# Patient Record
Sex: Female | Born: 1960 | Race: Black or African American | Hispanic: Yes | Marital: Single | Smoking: Never smoker
Health system: Southern US, Community
[De-identification: ages and names within clinical notes are randomized; demographics above are authoritative.]

## PROBLEM LIST (undated history)

## (undated) DIAGNOSIS — F329 Major depressive disorder, single episode, unspecified: Secondary | ICD-10-CM

## (undated) DIAGNOSIS — F32A Depression, unspecified: Secondary | ICD-10-CM

## (undated) HISTORY — PX: ABDOMINAL HYSTERECTOMY: SHX81

---

## 2012-11-24 ENCOUNTER — Encounter (HOSPITAL_BASED_OUTPATIENT_CLINIC_OR_DEPARTMENT_OTHER): Payer: Self-pay | Admitting: Family Medicine

## 2012-11-24 ENCOUNTER — Emergency Department (HOSPITAL_BASED_OUTPATIENT_CLINIC_OR_DEPARTMENT_OTHER)
Admission: EM | Admit: 2012-11-24 | Discharge: 2012-11-24 | Disposition: A | Discharge status: Home / Self Care | Payer: Self-pay | Attending: Emergency Medicine | Admitting: Emergency Medicine

## 2012-11-24 DIAGNOSIS — R52 Pain, unspecified: Secondary | ICD-10-CM | POA: Insufficient documentation

## 2012-11-24 DIAGNOSIS — F329 Major depressive disorder, single episode, unspecified: Secondary | ICD-10-CM | POA: Insufficient documentation

## 2012-11-24 DIAGNOSIS — K089 Disorder of teeth and supporting structures, unspecified: Secondary | ICD-10-CM | POA: Insufficient documentation

## 2012-11-24 DIAGNOSIS — G8929 Other chronic pain: Secondary | ICD-10-CM | POA: Insufficient documentation

## 2012-11-24 DIAGNOSIS — K0889 Other specified disorders of teeth and supporting structures: Secondary | ICD-10-CM

## 2012-11-24 DIAGNOSIS — F3289 Other specified depressive episodes: Secondary | ICD-10-CM | POA: Insufficient documentation

## 2012-11-24 DIAGNOSIS — Z79899 Other long term (current) drug therapy: Secondary | ICD-10-CM | POA: Insufficient documentation

## 2012-11-24 DIAGNOSIS — K029 Dental caries, unspecified: Secondary | ICD-10-CM | POA: Insufficient documentation

## 2012-11-24 HISTORY — DX: Major depressive disorder, single episode, unspecified: F32.9

## 2012-11-24 HISTORY — DX: Depression, unspecified: F32.A

## 2012-11-24 MED ORDER — BUPIVACAINE-EPINEPHRINE (PF) 0.5% -1:200000 IJ SOLN
INTRAMUSCULAR | Status: AC
  Filled 2012-11-24: qty 7.2

## 2012-11-24 MED ORDER — BUPIVACAINE-EPINEPHRINE PF 0.5-1:200000 % IJ SOLN
1.8000 mL | Freq: Once | INTRAMUSCULAR | Status: DC
  Filled 2012-11-24: qty 1.8

## 2012-11-24 MED ORDER — PENICILLIN V POTASSIUM 500 MG PO TABS: 500.0000 mg | ORAL_TABLET | Freq: Four times a day (QID) | ORAL | Status: AC

## 2012-11-24 NOTE — ED Provider Notes (Signed)
CSN: [id number]     Arrival date & time 11/24/12  4098 History     First MD Initiated Contact with Patient 11/24/12 1001     Chief Complaint  Patient presents with  . Dental Problem   (Consider location/radiation/quality/duration/timing/severity/associated sxs/prior Treatment) Patient is a 52 y.o. female presenting with tooth pain. The history is provided by the patient.  Dental Pain Location:  Lower Lower teeth location:  30/RL 1st molar Quality:  Aching Severity:  Severe Onset quality:  Gradual Duration: over 3 weeks. Timing:  Constant Progression:  Worsening Chronicity:  Chronic Context: poor dentition   Relieved by:  NSAIDs Exacerbated by: eating. Associated symptoms: no congestion, no difficulty swallowing, no drooling, no facial swelling, no fever, no gum swelling, no neck pain, no neck swelling and no trismus   Risk factors: no diabetes     Past Medical History  Diagnosis Date  . Depression    Past Surgical History  Procedure Laterality Date  . Abdominal hysterectomy     No family history on file. History  Substance Use Topics  . Smoking status: Never Smoker   . Smokeless tobacco: Not on file  . Alcohol Use: No   OB History   Grav Para Term Preterm Abortions TAB SAB Ect Mult Living                 Review of Systems  Constitutional: Negative for fever and chills.  HENT: Positive for dental problem. Negative for congestion, facial swelling, rhinorrhea, drooling, trouble swallowing, neck pain and voice change.   All other systems reviewed and are negative.    Allergies  Review of patient's allergies indicates no known allergies.  Home Medications   Current Outpatient Rx  Name  Route  Sig  Dispense  Refill  . FLUoxetine HCl (PROZAC PO)   Oral   Take by mouth.          BP 162/89  Pulse 89  Temp(Src) 97.8 F (36.6 C) (Oral)  Resp 16  Ht 5\' 4"  (1.626 m)  Wt 185 lb (83.915 kg)  BMI 31.74 kg/m2  SpO2 100% Physical Exam  Nursing note and  vitals reviewed. Constitutional: She is oriented to person, place, and time. She appears well-developed and well-nourished. No distress.  HENT:  Head: Normocephalic and atraumatic. No trismus in the jaw.  Right Ear: External ear normal.  Left Ear: External ear normal.  Mouth/Throat: Oropharynx is clear and moist and mucous membranes are normal. Abnormal dentition. Dental caries present. No dental abscesses or edematous.    Diffusely poor dentition. Multiple teeth missing, other teeth with cavities. No surrounding gingival erythema or dental abscess  Neck: Neck supple.  Pulmonary/Chest: Effort normal.  Abdominal: She exhibits no distension.  Lymphadenopathy:    She has no cervical adenopathy.  Neurological: She is alert and oriented to person, place, and time.    ED Course   NERVE BLOCK Date/Time: 11/24/2012 10:35 AM Performed by: Pricilla Loveless T Authorized by: Pricilla Loveless T Consent: Verbal consent obtained. Risks and benefits: risks, benefits and alternatives were discussed Indications: pain relief Body area: face/mouth Nerve: inferior alveolar Laterality: right Patient sedated: no Needle gauge: 27 G Location technique: anatomical landmarks Local anesthetic: bupivacaine 0.5% with epinephrine Anesthetic total: 1.8 ml Outcome: pain improved Patient tolerance: Patient tolerated the procedure well with no immediate complications.   (including critical care time)  Labs Reviewed - No data to display No results found. 1. Pain, dental     MDM  52 year old female with  acute on chronic right lower molar pain. No systemic symptoms facial swelling or signs of dental abscess. Pain relieved with a inferior alveolar block. Has overall poor dentition with likely underlying tooth infection. Will prescribe antibiotics and give outpatient dental referral.  Audree Camel, MD 11/24/12 1139

## 2012-11-24 NOTE — ED Notes (Signed)
Pt c/o "hole" to right lower tooth "for a while" but hurts worse recently. Pt sts she does not have a dentist.

## 2017-03-01 ENCOUNTER — Other Ambulatory Visit: Payer: Self-pay

## 2017-03-01 ENCOUNTER — Encounter (HOSPITAL_BASED_OUTPATIENT_CLINIC_OR_DEPARTMENT_OTHER): Payer: Self-pay | Admitting: Emergency Medicine

## 2017-03-01 ENCOUNTER — Emergency Department (HOSPITAL_BASED_OUTPATIENT_CLINIC_OR_DEPARTMENT_OTHER): Payer: Self-pay

## 2017-03-01 ENCOUNTER — Emergency Department (HOSPITAL_BASED_OUTPATIENT_CLINIC_OR_DEPARTMENT_OTHER)
Admission: EM | Admit: 2017-03-01 | Discharge: 2017-03-01 | Disposition: A | Discharge status: Home / Self Care | Payer: Self-pay | Attending: Emergency Medicine | Admitting: Emergency Medicine

## 2017-03-01 DIAGNOSIS — Z79899 Other long term (current) drug therapy: Secondary | ICD-10-CM | POA: Insufficient documentation

## 2017-03-01 DIAGNOSIS — M25561 Pain in right knee: Secondary | ICD-10-CM | POA: Insufficient documentation

## 2017-03-01 DIAGNOSIS — G8929 Other chronic pain: Secondary | ICD-10-CM | POA: Insufficient documentation

## 2017-03-01 DIAGNOSIS — F329 Major depressive disorder, single episode, unspecified: Secondary | ICD-10-CM | POA: Insufficient documentation

## 2017-03-01 NOTE — ED Provider Notes (Signed)
[facility] EMERGENCY DEPARTMENT Provider Note   CSN: [id number] Arrival date & time: 03/01/17  1313     History   Chief Complaint Chief Complaint  Patient presents with  . Knee Pain    HPI Kari DialsBetty Kerth is a 56 y.o. female.  The history is provided by the patient. No language interpreter was used.  Knee Pain      Kari Moyer is a 56 y.o. female who presents to the Emergency Department complaining of right knee pain.  She reports one year of right knee pain.  Pain is constant but worse at night and with standing.  Pain is located throughout the knee.  No swelling to the knee.  No fevers, chest pain, shortness of breath.  She has tried Biofreeze cream as well as Aleve at home with no significant change in her symptoms.  Symptoms are mild to moderate and constant in nature.  Past Medical History:  Diagnosis Date  . Depression     There are no active problems to display for this patient.   Past Surgical History:  Procedure Laterality Date  . ABDOMINAL HYSTERECTOMY      OB History    No data available       Home Medications    Prior to Admission medications   Medication Sig Start Date End Date Taking? Authorizing Provider  FLUoxetine HCl (PROZAC PO) Take by mouth.    [provider]    Family History History reviewed. No pertinent family history.  Social History Social History   Tobacco Use  . Smoking status: Never Smoker  . Smokeless tobacco: Never Used  Substance Use Topics  . Alcohol use: No  . Drug use: No     Allergies   Patient has no known allergies.   Review of Systems Review of Systems  All other systems reviewed and are negative.    Physical Exam Updated Vital Signs BP (!) 179/84 (BP Location: Left Arm)   Pulse 89   Temp 98.1 F (36.7 C) (Oral)   Resp 16   Ht 5\' 4"  (1.626 m)   Wt 86.2 kg (190 lb)   SpO2 100%   BMI 32.61 kg/m   Physical Exam  Constitutional: She is oriented to person, place, and time.  She appears well-developed and well-nourished.  HENT:  Head: Normocephalic and atraumatic.  Cardiovascular: Normal rate and regular rhythm.  No murmur heard. Pulmonary/Chest: Effort normal and breath sounds normal. No respiratory distress.  Abdominal: There is no guarding.  Musculoskeletal: She exhibits no edema or tenderness.  2+ DP pulses bilaterally.  There is no significant tenderness to palpation throughout the right knee.  No edema to the knee.  Flexion extension is intact in the knee.  No ligamentous laxity to the knee.  Neurological: She is alert and oriented to person, place, and time.  Skin: Skin is warm and dry.  Psychiatric: She has a normal mood and affect. Her behavior is normal.  Nursing note and vitals reviewed.    ED Treatments / Results  Labs (all labs ordered are listed, but only abnormal results are displayed) Labs Reviewed - No data to display  EKG  EKG Interpretation None       Radiology Dg Knee Complete 4 Views Right  Result Date: 03/01/2017 CLINICAL DATA:  Chronic worsening right knee pain since a fall approximately 1 year ago. EXAM: RIGHT KNEE - COMPLETE 4+ VIEW COMPARISON:  Report of her right knee series dated June 12, 2001 FINDINGS: The bones  are subjectively adequately mineralized. The joint spaces are well maintained. There is mild beaking of the tibial spines. There is no acute or healing fracture. There is no joint effusion or chondrocalcinosis. IMPRESSION: There is no acute bony abnormality of the right knee. Mild degenerative beaking of the tibial spines is observed. Electronically Signed   By: David  [place] M.D.   On: 03/01/2017 14:02    Procedures Procedures (including critical care time)  Medications Ordered in ED Medications - No data to display   Initial Impression / Assessment and Plan / ED Course  I have reviewed the triage vital signs and the nursing notes.  Pertinent labs & imaging results that were available during my care of  the patient were reviewed by me and considered in my medical decision making (see chart for details).     Patient here for evaluation of 1 year of right knee pain infectious process.  No evidence of fracture or dislocation on plain films.  Discussed with patient the unclear etiology of her chronic pain, question arthritis.  Discussed with patient home care with Tylenol and Aleve per label instructions.  Will provide knee sleeve for comfort.  Discussed using over-the-counter topical creams such as Lidoderm cream.  Discussed outpatient orthopedics follow-up and return precautions.  Final Clinical Impressions(s) / ED Diagnoses   Final diagnoses:  Chronic pain of right knee    ED Discharge Orders    None       Tilden Fossa, MD 03/01/17 1428

## 2017-03-01 NOTE — ED Triage Notes (Signed)
Reports right knee pain x 3-4 years.  States this is getting worse than in the past.  Reports injury 3-4 years ago without xrays.

## 2020-03-31 ENCOUNTER — Emergency Department (HOSPITAL_BASED_OUTPATIENT_CLINIC_OR_DEPARTMENT_OTHER): Payer: BLUE CROSS/BLUE SHIELD

## 2020-03-31 ENCOUNTER — Other Ambulatory Visit: Payer: Self-pay

## 2020-03-31 ENCOUNTER — Encounter (HOSPITAL_BASED_OUTPATIENT_CLINIC_OR_DEPARTMENT_OTHER): Payer: Self-pay | Admitting: Emergency Medicine

## 2020-03-31 ENCOUNTER — Emergency Department (HOSPITAL_BASED_OUTPATIENT_CLINIC_OR_DEPARTMENT_OTHER)
Admission: EM | Admit: 2020-03-31 | Discharge: 2020-03-31 | Disposition: A | Discharge status: Home / Self Care | Payer: BLUE CROSS/BLUE SHIELD | Attending: Emergency Medicine | Admitting: Emergency Medicine

## 2020-03-31 DIAGNOSIS — M25562 Pain in left knee: Secondary | ICD-10-CM | POA: Insufficient documentation

## 2020-03-31 DIAGNOSIS — M25462 Effusion, left knee: Secondary | ICD-10-CM | POA: Insufficient documentation

## 2020-03-31 MED ORDER — DICLOFENAC SODIUM 1 % EX GEL: 2.0000 g | Freq: Four times a day (QID) | CUTANEOUS | 0 refills | Status: AC

## 2020-03-31 NOTE — ED Provider Notes (Signed)
[facility] EMERGENCY DEPARTMENT Provider Note   CSN: [id number] Arrival date & time: 03/31/20  1326     History Chief Complaint  Patient presents with  . Knee Pain    Kari Moyer is a 59 y.o. female.  59 year old female with report of pain in her anterior left knee for the past few days.  Patient states she has a history of arthritis in the right knee, is concerned she has arthritis in the left knee now.  Denies any specific injury.  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  Pain is worse with flexion of the knee and bearing weight.  Patient has been wearing a knee sleeve and taking Tylenol without improvement in her pain.  No other injuries, complaints, concerns.        Past Medical History:  Diagnosis Date  . Depression     There are no problems to display for this patient.   Past Surgical History:  Procedure Laterality Date  . ABDOMINAL HYSTERECTOMY       OB History   No obstetric history on file.     No family history on file.  Social History   Tobacco Use  . Smoking status: Never Smoker  . Smokeless tobacco: Never Used  Substance Use Topics  . Alcohol use: No  . Drug use: No    Home Medications Prior to Admission medications   Medication Sig Start Date End Date Taking? Authorizing Provider  diclofenac Sodium (VOLTAREN) 1 % GEL Apply 2 g topically 4 (four) times daily. 03/31/20   Jeannie Fend, PA-C  FLUoxetine HCl (PROZAC PO) Take by mouth.    [provider]    Allergies    Patient has no known allergies.  Review of Systems   Review of Systems  Constitutional: Negative for fever.  Musculoskeletal: Positive for arthralgias and joint swelling.  Skin: Negative for color change, rash and wound.  Neurological: Negative for weakness and numbness.    Physical Exam Updated Vital Signs BP (!) 164/83 (BP Location: Right Arm)   Pulse 62   Temp 98.4  F (36.9 C) (Oral)   Resp 17   Ht 5\' 4"  (1.626 m)   Wt 83.9 kg   SpO2 100%   BMI 31.76 kg/m   Physical Exam Vitals and nursing note reviewed.  Constitutional:      General: She is not in acute distress.    Appearance: She is well-developed. She is not diaphoretic.  HENT:     Head: Normocephalic and atraumatic.  Cardiovascular:     Pulses: Normal pulses.  Pulmonary:     Effort: Pulmonary effort is normal.  Musculoskeletal:        General: Tenderness present. No swelling or deformity.     Left knee: No swelling, effusion or erythema. Normal range of motion. No LCL laxity, MCL laxity, ACL laxity or PCL laxity.      Legs:  Skin:    General: Skin is warm and dry.     Findings: No erythema.  Neurological:     Mental Status: She is alert and oriented to person, place, and time.     Sensory: No sensory deficit.  Psychiatric:        Behavior: Behavior normal.     ED Results / Procedures / Treatments   Labs (all labs ordered are listed, but only abnormal results are displayed) Labs Reviewed - No  data to display  EKG None  Radiology DG Knee Complete 4 Views Left  Result Date: 03/31/2020 CLINICAL DATA:  Left knee pain for 1 month, no known injury, initial encounter EXAM: LEFT KNEE - COMPLETE 4+ VIEW COMPARISON:  None. FINDINGS: Small joint effusion is noted. No acute fracture or dislocation is noted. Mild patellofemoral degenerative changes are seen. IMPRESSION: Mild degenerative change with small joint effusion. No acute abnormality noted. Electronically Signed   By: Alcide Clever M.D.   On: 03/31/2020 14:14    Procedures Procedures (including critical care time)  Medications Ordered in ED Medications - No data to display  ED Course  I have reviewed the triage vital signs and the nursing notes.  Pertinent labs & imaging results that were available during my care of the patient were reviewed by me and considered in my medical decision making (see chart for  details).  Clinical Course as of Apr 01 1635  Wynelle Link Mar 31, 2020  7429 59 year old female with left knee pain.  On exam found to have medial joint line tenderness.  No overlying erythema, normal range of motion, no ligamentous laxity.  X-ray shows mild degenerative changes with small joint effusion.  Discussed results with patient.  Plan is to continue wearing the sleeve, may apply Voltaren gel.  Referred to sports medicine for follow-up.   [LM]    Clinical Course User Index [LM] Alden Hipp   MDM Rules/Calculators/A&P                          Final Clinical Impression(s) / ED Diagnoses Final diagnoses:  Acute pain of left knee  Effusion of left knee    Rx / DC Orders ED Discharge Orders         Ordered    diclofenac Sodium (VOLTAREN) 1 % GEL  4 times daily        03/31/20 1631           Jeannie Fend, PA-C 03/31/20 1636    Derwood Kaplan, MD 04/01/20 [phone]

## 2020-03-31 NOTE — ED Triage Notes (Signed)
L knee pain x 1 month. No known injury.

## 2020-03-31 NOTE — Discharge Instructions (Addendum)
Apply Voltaren gel as prescribed. Follow up with sports medicine.

## 2020-04-08 ENCOUNTER — Ambulatory Visit (INDEPENDENT_AMBULATORY_CARE_PROVIDER_SITE_OTHER): Payer: BLUE CROSS/BLUE SHIELD | Admitting: Family Medicine

## 2020-04-08 ENCOUNTER — Other Ambulatory Visit: Payer: Self-pay

## 2020-04-08 ENCOUNTER — Encounter: Payer: Self-pay | Admitting: Family Medicine

## 2020-04-08 ENCOUNTER — Ambulatory Visit: Payer: Self-pay

## 2020-04-08 VITALS — BP 169/81 | HR 77 | Ht 64.0 in | Wt 187.0 lb

## 2020-04-08 DIAGNOSIS — M25562 Pain in left knee: Secondary | ICD-10-CM

## 2020-04-08 DIAGNOSIS — M84453A Pathological fracture, unspecified femur, initial encounter for fracture: Secondary | ICD-10-CM | POA: Diagnosis not present

## 2020-04-08 MED ORDER — HYDROCODONE-ACETAMINOPHEN 5-325 MG PO TABS: 1.0000 | ORAL_TABLET | Freq: Three times a day (TID) | ORAL | 0 refills | Status: AC | PRN

## 2020-04-08 NOTE — Assessment & Plan Note (Signed)
Pain has been ongoing since August or September.  Has tried steroid injections and medications with no improvement of her symptoms.  Findings on ultrasound revealing for degenerative changes but also suspicious for insufficiency fracture. -Counseled on home exercise therapy and supportive care. -Norco. -Duexis samples. -Hinged knee brace. -Rollator. -MRI to evaluate for insufficiency fracture.

## 2020-04-08 NOTE — Progress Notes (Signed)
Medication Samples have been provided to the patient.  Drug name: Duexis       Strength: 800mg /26.6mg         Qty: 2 boxes  LOT:  Exp.Date: 08/2020  Dosing instructions: Take 1 tablet by mouth three (3) times a day.  The patient has been instructed regarding the correct time, dose, and frequency of taking this medication, including desired effects and most common side effects.   09/2020, Kathi Simpers 11:49 AM 04/08/2020

## 2020-04-08 NOTE — Progress Notes (Signed)
  Kari Moyer - 59 y.o. female MRN [id number]  Date of birth: 15-Dec-1960  SUBJECTIVE:  Including CC & ROS.  Chief Complaint  Patient presents with  . Knee Pain    Left x 2 months    Kari Moyer is a 59 y.o. female that is presenting with acute on chronic left knee pain.  The pain has been ongoing since before September.  She has received a steroid injection.  She had little improvement with this injection.  She is also tried diclofenac topical cream as well as meloxicam with limited improvement.  She is having severe pain on the medial aspect of the knee.  Has mechanical symptoms intermittently.  Denies any injury or inciting event.  No prior history of pain or surgery.  Independent review of the left knee x-ray from 12/5 shows mild degenerative changes of the medial compartment with a loose body posteriorly.  Review of Systems See HPI   HISTORY: Past Medical, Surgical, Social, and Family History Reviewed & Updated per EMR.   Pertinent Historical Findings include:  Past Medical History:  Diagnosis Date  . Depression     Past Surgical History:  Procedure Laterality Date  . ABDOMINAL HYSTERECTOMY      No family history on file.  Social History   Socioeconomic History  . Marital status: Single    Spouse name: Not on file  . Number of children: Not on file  . Years of education: Not on file  . Highest education level: Not on file  Occupational History  . Not on file  Tobacco Use  . Smoking status: Never Smoker  . Smokeless tobacco: Never Used  Substance and Sexual Activity  . Alcohol use: No  . Drug use: No  . Sexual activity: Not on file  Other Topics Concern  . Not on file  Social History Narrative  . Not on file   Social Determinants of Health   Financial Resource Strain: Not on file  Food Insecurity: Not on file  Transportation Needs: Not on file  Physical Activity: Not on file  Stress: Not on file  Social Connections: Not on file  Intimate Partner  Violence: Not on file     PHYSICAL EXAM:  VS: BP (!) 169/81   Pulse 77   Ht 5\' 4"  (1.626 m)   Wt 187 lb (84.8 kg)   BMI 32.10 kg/m  Physical Exam Gen: NAD, alert, cooperative with exam, well-appearing MSK:  Left knee: No obvious effusion. Limited extension and flexion. Tenderness palpation of the medial compartment. Neurovascularly intact  Limited ultrasound: Left knee:  Moderate effusion. Normal-appearing quadricep patellar tendon. Moderate medial joint space narrowing with outpouching of the meniscus. Hyperemia over the medial meniscus as well as the medial femoral condyle.  This would suggest that an insufficiency fracture of the medial femoral condyle.  Summary: Findings suggest insufficiency fracture of the medial femoral condyle.  Ultrasound and interpretation by , MD    ASSESSMENT & PLAN:   Insufficiency fracture of medial femoral condyle (HCC) Pain has been ongoing since August or September.  Has tried steroid injections and medications with no improvement of her symptoms.  Findings on ultrasound revealing for degenerative changes but also suspicious for insufficiency fracture. -Counseled on home exercise therapy and supportive care. -Norco. -Duexis samples. -Hinged knee brace. -Rollator. -MRI to evaluate for insufficiency fracture.

## 2020-04-08 NOTE — Patient Instructions (Signed)
Nice to meet you  Please try ice  Please limit the weight bearing.  Please use the duexis  Please use the norco for severe pain  Please send me a message in MyChart with any questions or updates.  We will schedule a virtual visit once the MRI is resulted.   --Dr. Jordan Likes

## 2020-04-13 ENCOUNTER — Other Ambulatory Visit: Payer: Self-pay

## 2020-04-13 ENCOUNTER — Ambulatory Visit (INDEPENDENT_AMBULATORY_CARE_PROVIDER_SITE_OTHER): Payer: BLUE CROSS/BLUE SHIELD

## 2020-04-13 DIAGNOSIS — M84453A Pathological fracture, unspecified femur, initial encounter for fracture: Secondary | ICD-10-CM

## 2020-04-13 DIAGNOSIS — S83242A Other tear of medial meniscus, current injury, left knee, initial encounter: Secondary | ICD-10-CM | POA: Diagnosis not present

## 2020-04-13 DIAGNOSIS — S72435A Nondisplaced fracture of medial condyle of left femur, initial encounter for closed fracture: Secondary | ICD-10-CM

## 2020-04-17 ENCOUNTER — Telehealth (INDEPENDENT_AMBULATORY_CARE_PROVIDER_SITE_OTHER): Payer: BLUE CROSS/BLUE SHIELD | Admitting: Family Medicine

## 2020-04-17 DIAGNOSIS — M1712 Unilateral primary osteoarthritis, left knee: Secondary | ICD-10-CM | POA: Diagnosis not present

## 2020-04-17 DIAGNOSIS — M84453A Pathological fracture, unspecified femur, initial encounter for fracture: Secondary | ICD-10-CM | POA: Diagnosis not present

## 2020-04-17 NOTE — Assessment & Plan Note (Signed)
MRI was revealing for subchondral fracture of the central medial femoral condyle with meniscal tearing in the medial meniscus and grade 1-2 MCL sprain. She continues to have pain. Has not been able to get the rollator.  - counseled on home exercise therapy and supportive care - try gel injections  -Could consider physical therapy.

## 2020-04-17 NOTE — Progress Notes (Addendum)
Virtual Visit via Telephone Note  I connected with Kari Moyer on 04/22/20 at  8:00 AM EST by telephone and verified that I am speaking with the correct person using two identifiers.  Location: Patient: home Provider: work    I discussed the limitations, risks, security and privacy concerns of performing an evaluation and management service by telephone and the availability of in person appointments. I also discussed with the patient that there may be a patient responsible charge related to this service. The patient expressed understanding and agreed to proceed.   History of Present Illness:  Kari Moyer is a 59 year old female that is following up after the MRI of her left knee.  This was revealing for the subchondral fracture as well as meniscal tearing and MCL sprain.  She also had a moderate effusion.  She continues to have pain and has not been able to get the Rollator yet.   Observations/Objective:   Assessment and Plan:  Insufficiency fracture of the medial femoral condyle: MRI was revealing for subchondral fracture of the central medial femoral condyle with meniscal tearing in the medial meniscus and grade 1-2 MCL sprain. She continues to have pain. Has not been able to get the rollator.  - counseled on home exercise therapy and supportive care - try gel injections  -Could consider physical therapy.  OA knee:  - gel injections.   Follow Up Instructions:    I discussed the assessment and treatment plan with the patient. The patient was provided an opportunity to ask questions and all were answered. The patient agreed with the plan and demonstrated an understanding of the instructions.   The patient was advised to call back or seek an in-person evaluation if the symptoms worsen or if the condition fails to improve as anticipated.  I provided 5 minutes of non-face-to-face time during this encounter.   Clare Gandy, MD

## 2020-04-18 ENCOUNTER — Encounter: Payer: Self-pay | Admitting: Family Medicine

## 2020-04-18 DIAGNOSIS — M179 Osteoarthritis of knee, unspecified: Secondary | ICD-10-CM | POA: Insufficient documentation

## 2020-04-22 NOTE — Assessment & Plan Note (Signed)
Showing changes on MRI.  - gel injections.

## 2020-06-04 ENCOUNTER — Telehealth: Payer: Self-pay | Admitting: Family Medicine

## 2020-06-04 NOTE — Telephone Encounter (Signed)
-----   Message from Lizbeth Bark sent at 06/03/2020  8:35 AM EST ----- Left vm regarding scheduling an appt.

## 2020-06-04 NOTE — Telephone Encounter (Signed)
Called pt to f/u on Injection --Pt decided to keep [employer] as [facility] ,but says has medicaid 2ndary--Advised pt again that [employer] is a [facility] specific insurance & she has to go to their providers & or get permission from ( [facility]) to use Dr.Schmitz services.  --Per pt will call us back after she spks w/[facility].  -glh

## 2022-03-11 IMAGING — CR DG KNEE COMPLETE 4+V*L*
4 series · 4 of 4 positions shown · non-contrast
Comparison: None.

CLINICAL DATA: Left knee pain for 1 month, no known injury, initial
encounter

EXAM:
LEFT KNEE - COMPLETE 4+ VIEW

[t knee ap left]
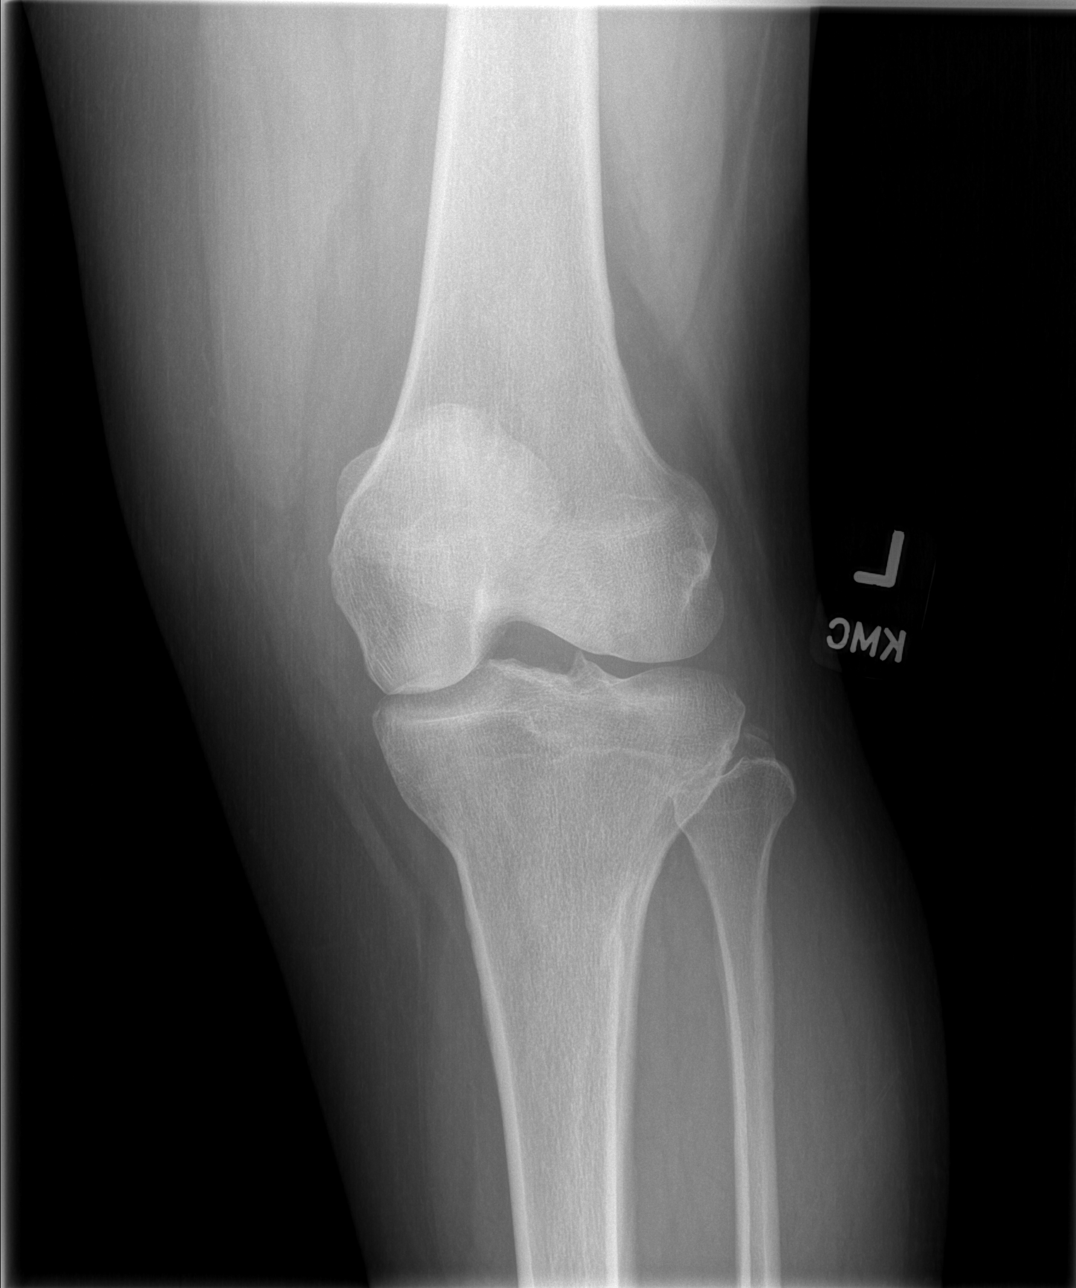

[t knee oblique left (1 of 2)]
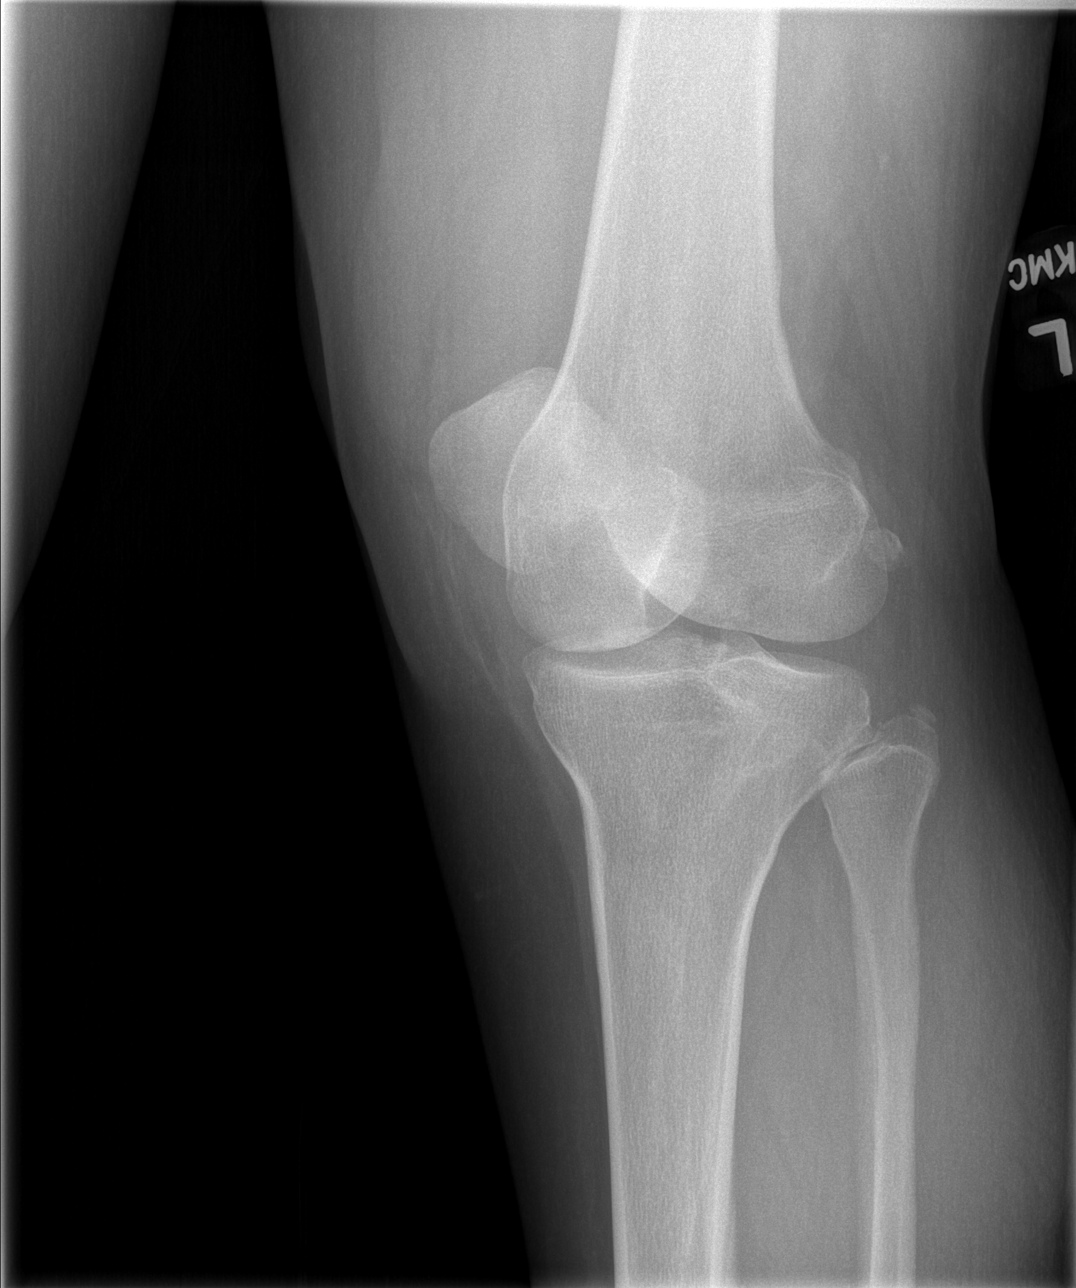

[t knee oblique left (2 of 2)]
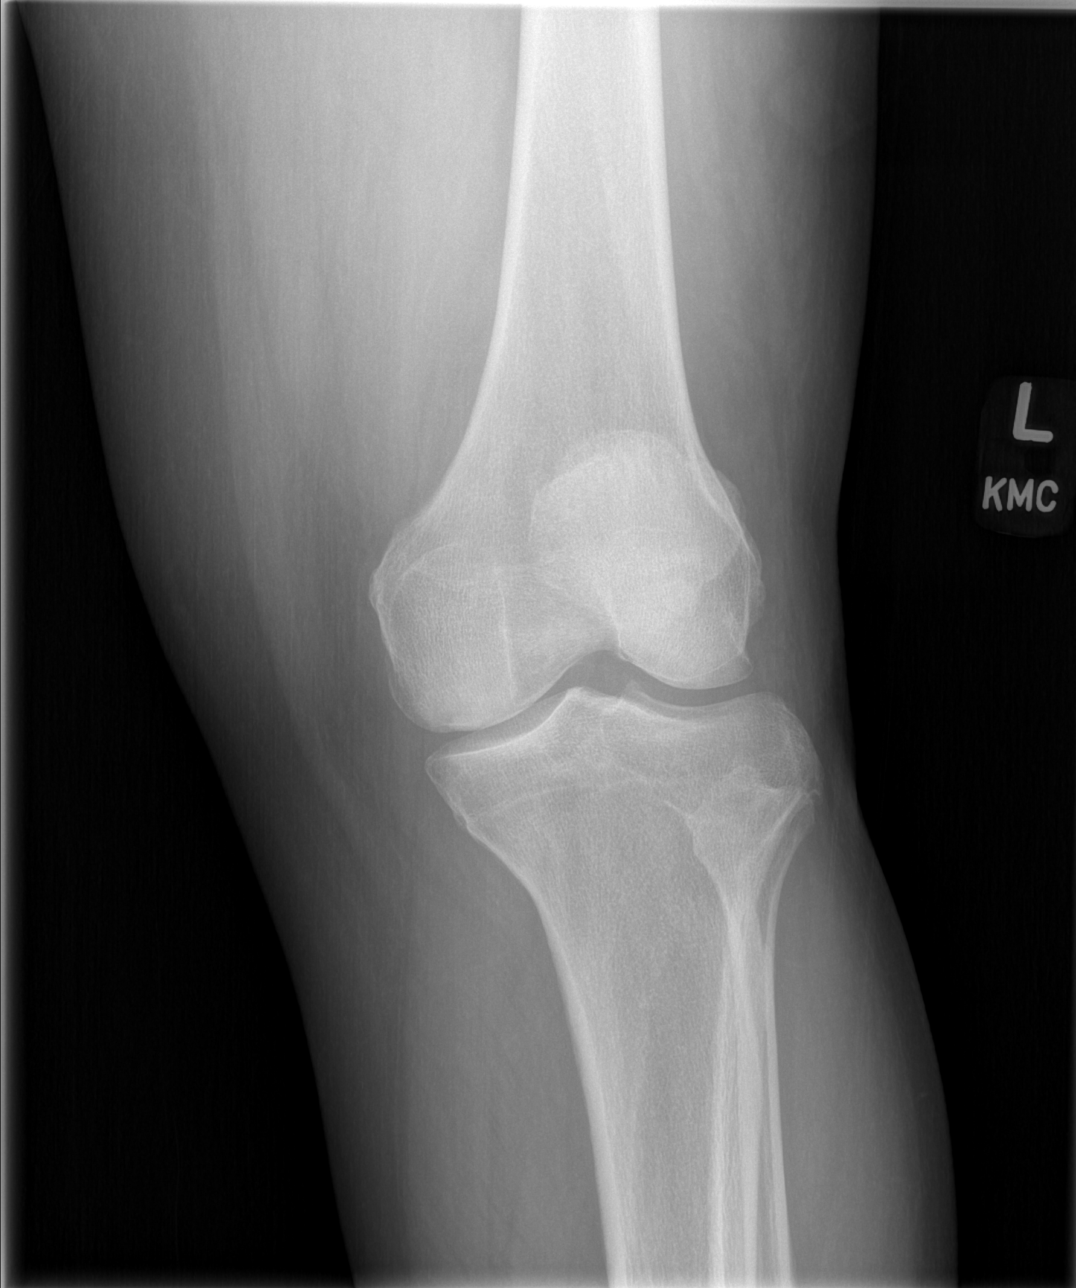

[t knee lat left]
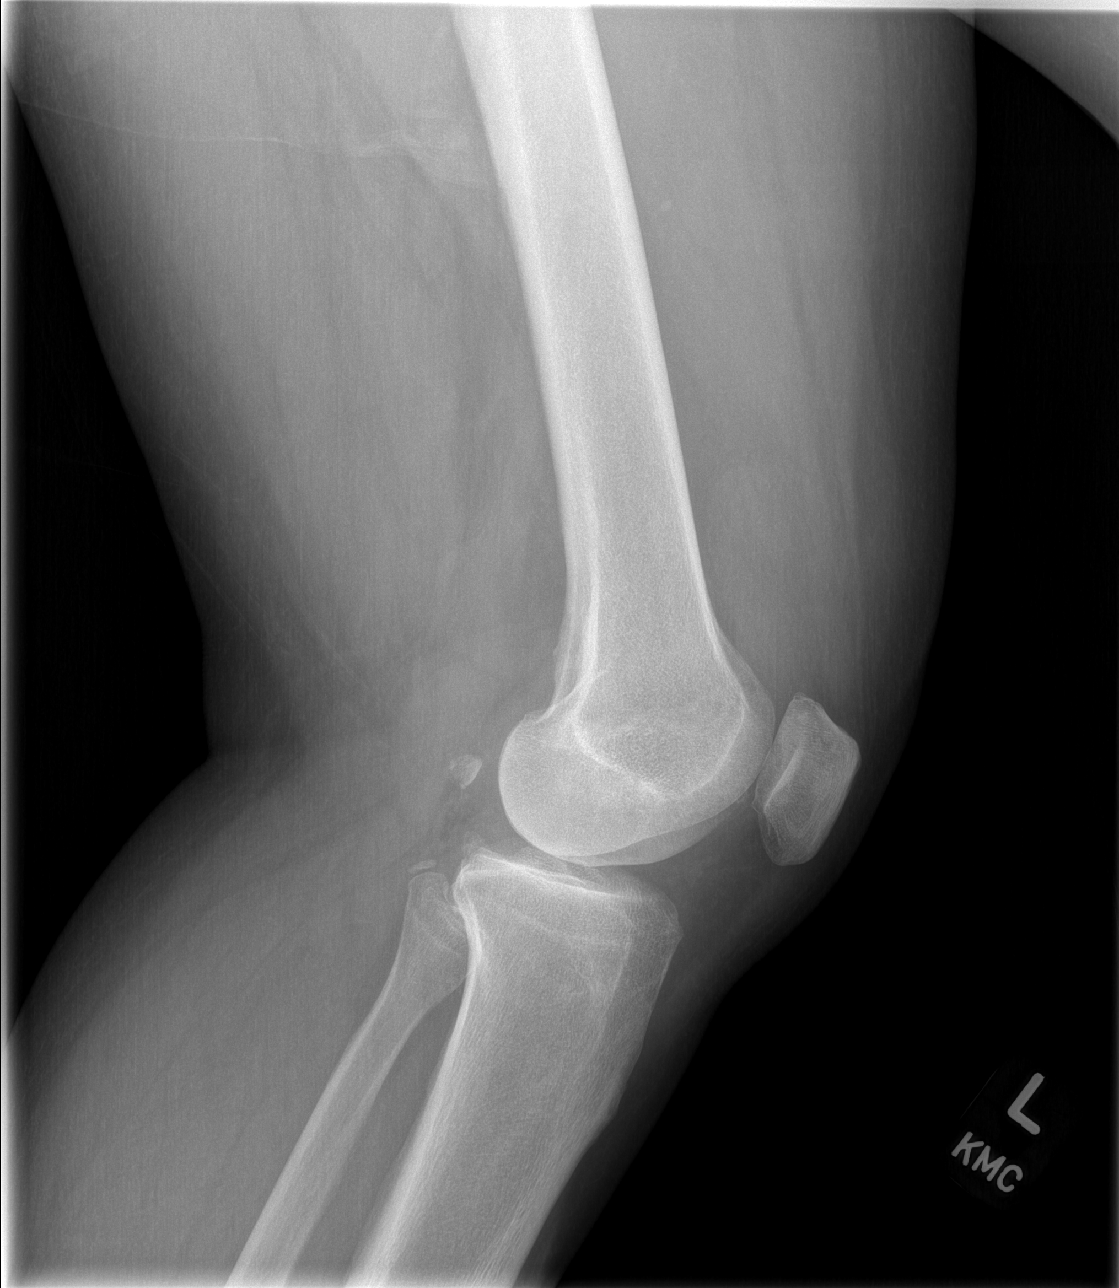

[4 of 4 positions shown; findings below may reference images not displayed]

FINDINGS: Small joint effusion is noted. No acute fracture or dislocation is
noted. Mild patellofemoral degenerative changes are seen.
IMPRESSION: Mild degenerative change with small joint effusion. No acute
abnormality noted.

## 2022-08-13 ENCOUNTER — Encounter: Payer: Self-pay | Admitting: *Deleted
